# Patient Record
Sex: Female | Born: 2000 | Race: White | Hispanic: No | Marital: Single | Smoking: Never smoker
Health system: Southern US, Community
[De-identification: ages and names within clinical notes are randomized; demographics above are authoritative.]

---
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
## 2021-02-07 IMAGING — CR DG ANKLE COMPLETE 3+V*R*
1 series · 3 of 3 positions shown · non-contrast
Comparison: None.

CLINICAL DATA: Right ankle pain and swelling after twisting injury
while walking.

EXAM:
RIGHT ANKLE - COMPLETE 3+ VIEW

[Series 1: dg ankle complete right · 0.14mm/px · 3 of 3 slices shown]
[im 1/3]
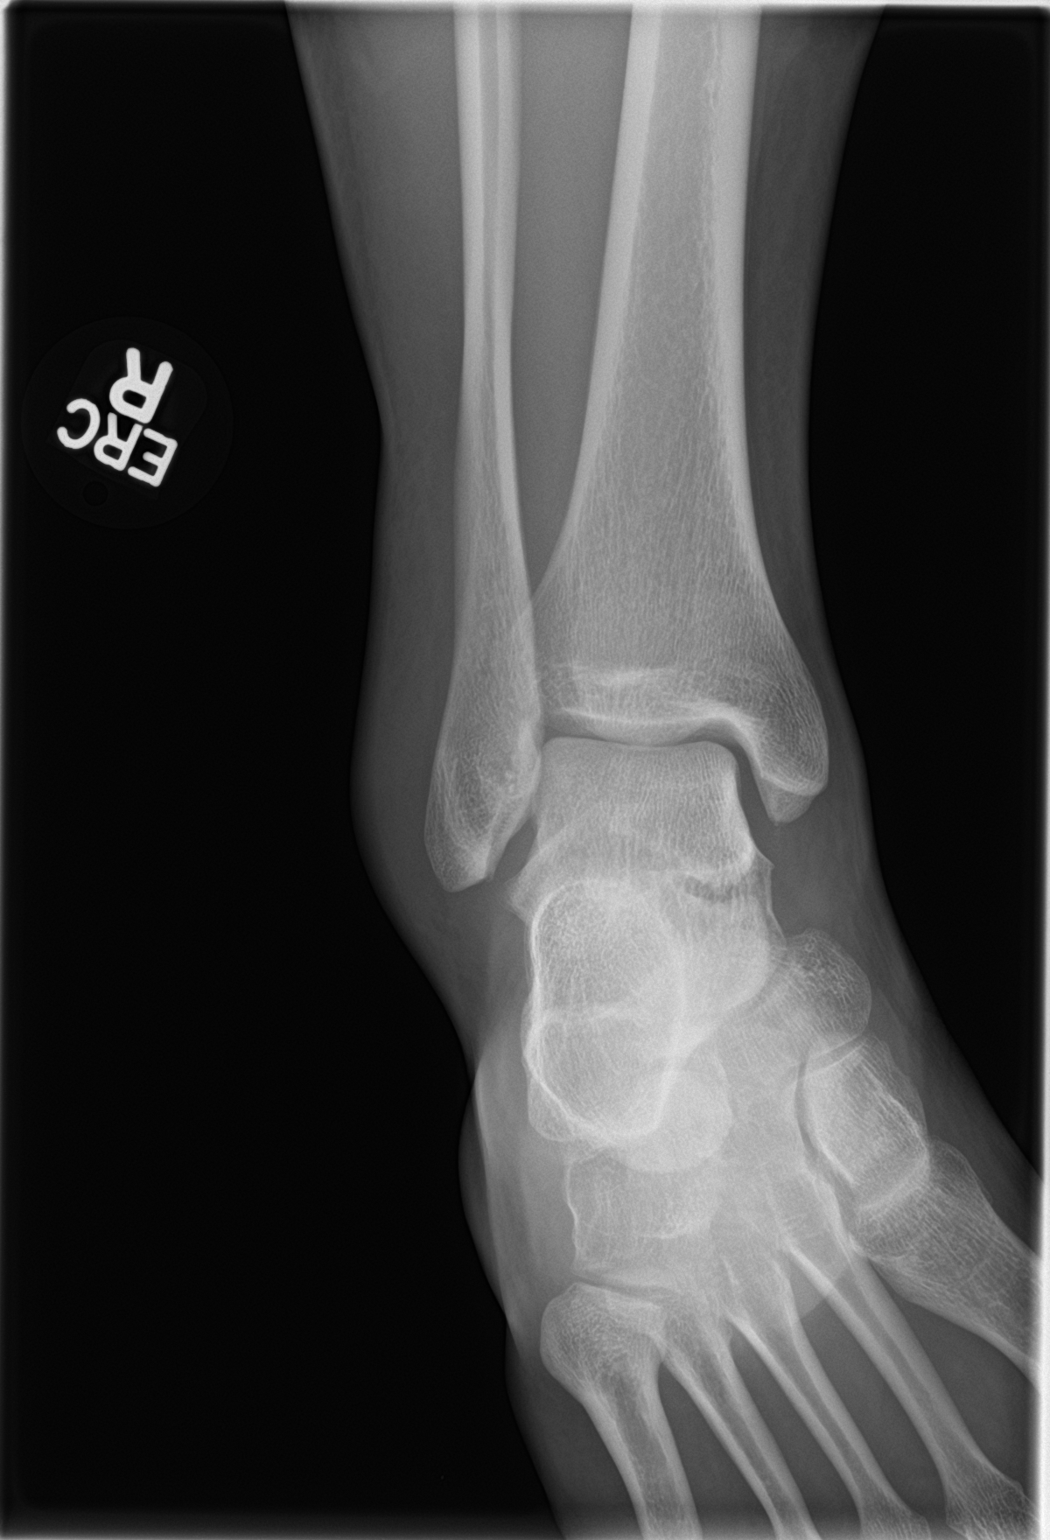
[im 2/3]
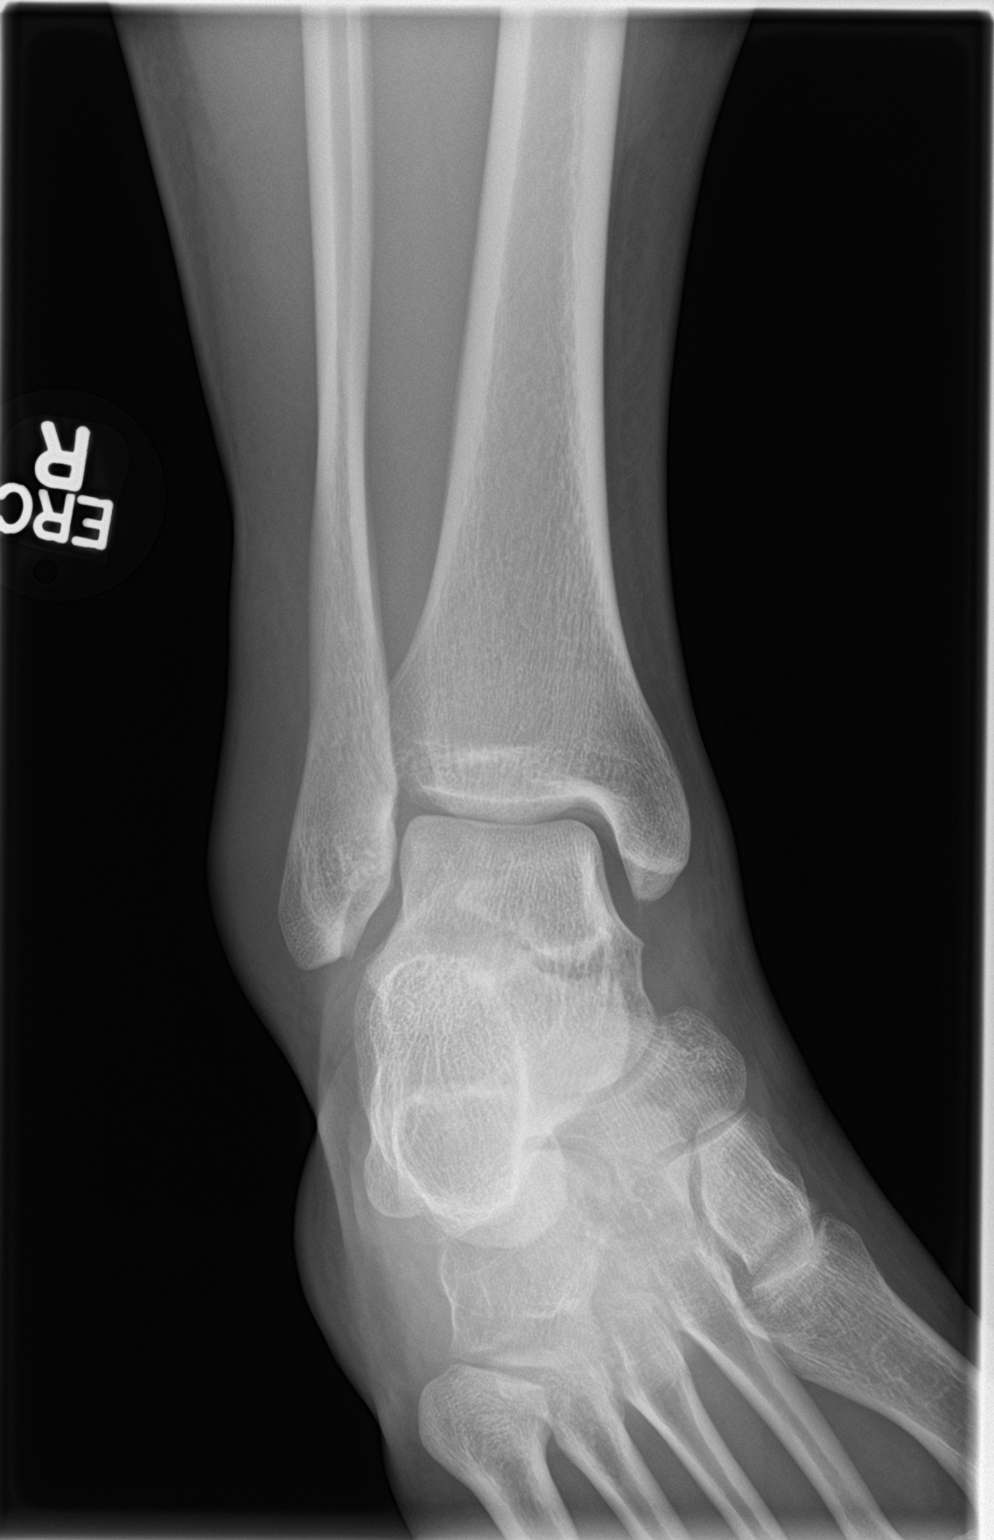
[im 3/3]
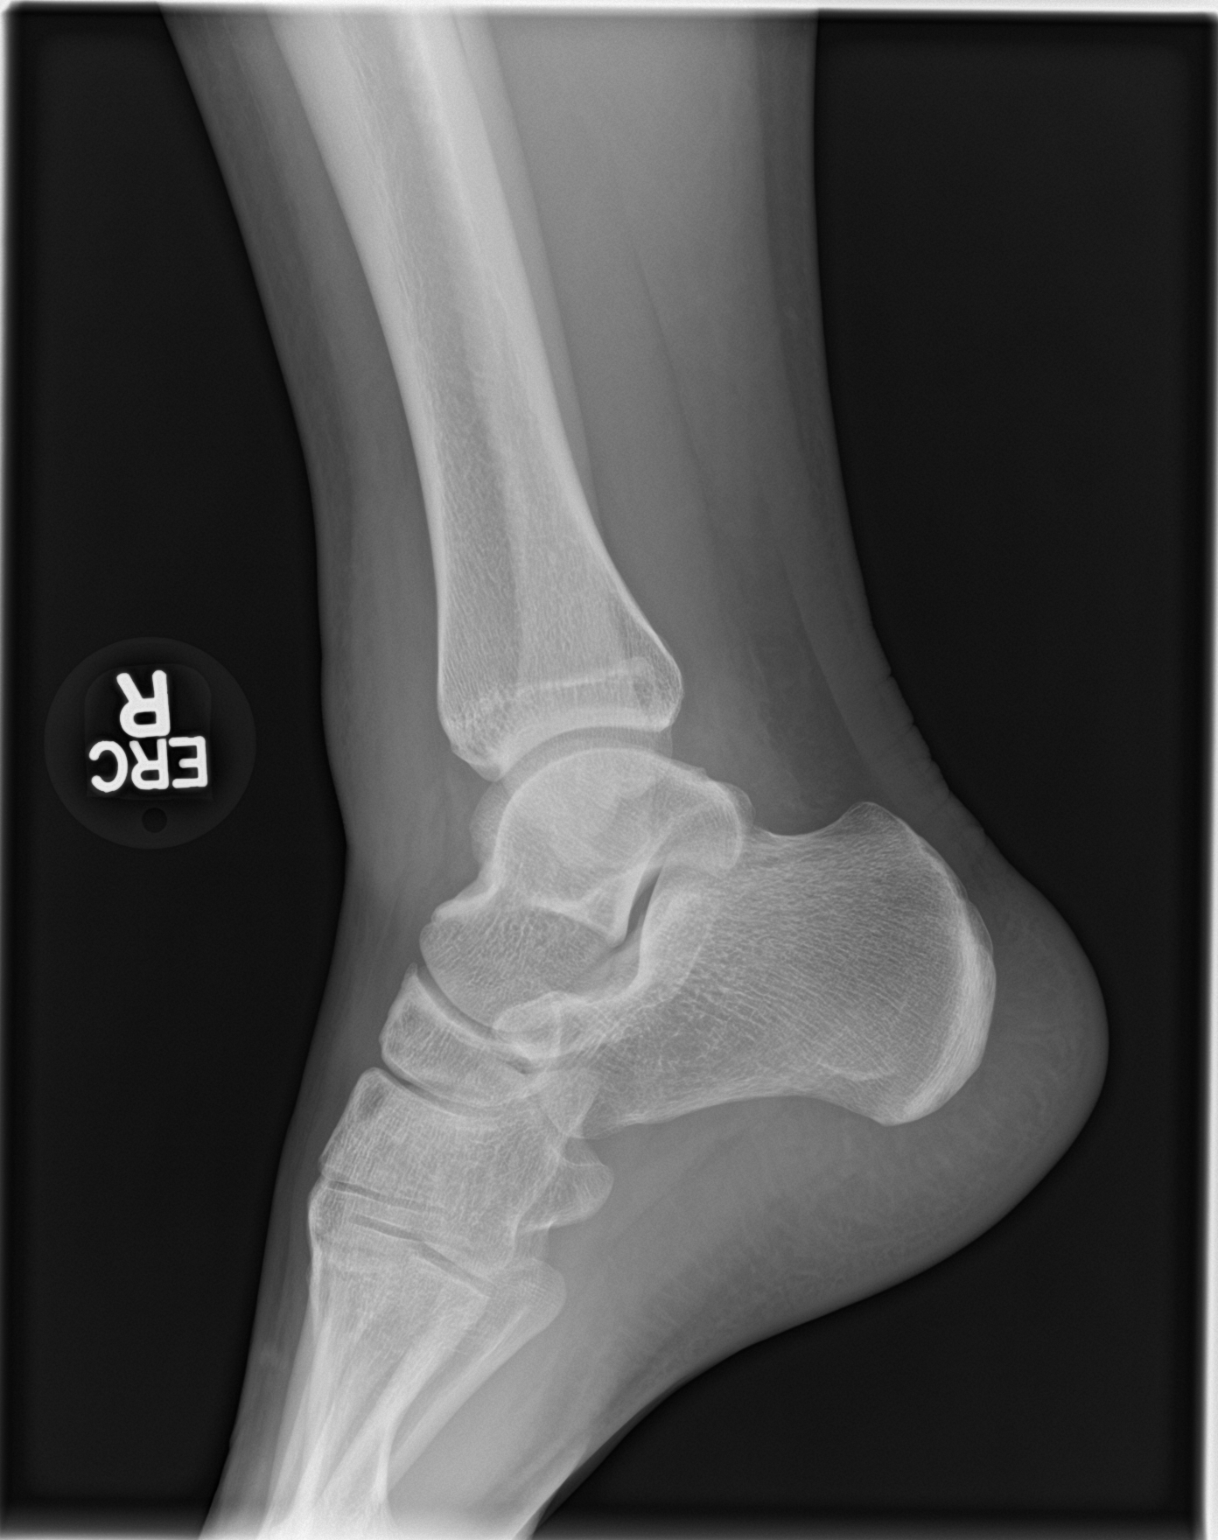

[3 of 3 positions shown; findings below may reference images not displayed]

FINDINGS: No acute fracture or dislocation. Slight widening of the lateral
clear space. Small tibial talar joint effusion. Anterolateral soft
tissue edema.
IMPRESSION: 1. No acute fracture or dislocation.
2. Slight widening of the lateral clear space which joint effusion
and soft tissue edema. Findings can be seen with
ligamentous/syndesmotic injury.

## 2022-08-18 ENCOUNTER — Ambulatory Visit: Payer: Managed Care, Other (non HMO) | Admitting: Adult Health

## 2022-09-05 ENCOUNTER — Ambulatory Visit: Payer: Managed Care, Other (non HMO) | Admitting: Family Medicine

## 2022-10-18 ENCOUNTER — Encounter: Payer: Self-pay | Admitting: Adult Health

## 2022-10-18 ENCOUNTER — Ambulatory Visit (INDEPENDENT_AMBULATORY_CARE_PROVIDER_SITE_OTHER): Payer: Managed Care, Other (non HMO) | Admitting: Adult Health

## 2022-10-18 VITALS — BP 112/72 | HR 74 | Temp 97.3°F | Ht 64.0 in | Wt 145.0 lb

## 2022-10-18 DIAGNOSIS — Z113 Encounter for screening for infections with a predominantly sexual mode of transmission: Secondary | ICD-10-CM

## 2022-10-18 NOTE — Progress Notes (Unsigned)
[place] [place], [place] [address] Phone: [phone] Fax: [phone]   Office Visit Note  Patient Name: Suzanne Rose  Date of [id number]  Med Rec number [id number]  Date of Service: 10/19/2022  Patient has no known allergies.  Chief Complaint  Patient presents with   STD     HPI  Patient is here reporting she was last tested in Jan 2022, she broke up with her long term boyfriend in Jan 2024.  Since then, she has had more than 5 partners.  She has sex with female partners and has oral and vaginal sex. She has been using condoms 50% of the time. She is specifically requesting Urine testing.   Current Medication:  Outpatient Encounter Medications as of 10/18/2022  Medication Sig   sertraline (ZOLOFT) 100 MG tablet Take 100 mg by mouth every morning.   traZODone (DESYREL) 100 MG tablet Take 100 mg by mouth at bedtime as needed.   LATUDA 40 MG TABS tablet SMARTSIG:1 Tablet(s) By Mouth Every Evening (Patient not taking: Reported on 10/18/2022)   lidocaine (XYLOCAINE) 2 % solution Use as directed 10 mLs in the mouth or throat as needed. (Patient not taking: Reported on 10/18/2022)   predniSONE (DELTASONE) 10 MG tablet Take 6 tablets on day 1, take 5 tablets on day 2, take 4 tablets on day 3, take 3 tablets on day 4, take 2 tablets on day 5, take 1 tablet on day 6 (Patient not taking: Reported on 10/18/2022)   No facility-administered encounter medications on file as of 10/18/2022.      Medical History: History reviewed. No pertinent past medical history.   Vital Signs: BP 112/72   Pulse 74   Temp (!) 97.3 F (36.3 C) (Tympanic)   Ht 5\' 4"  (1.626 m)   Wt 145 lb (65.8 kg)   SpO2 98%   BMI 24.89 kg/m    Review of Systems  Constitutional:  Negative for chills, fatigue and fever.  Genitourinary:  Negative for decreased urine volume, difficulty urinating, flank pain, genital sores and urgency.    Physical Exam Vitals and nursing note  reviewed.  Constitutional:      Appearance: Normal appearance.  Neurological:     Mental Status: She is alert.  Psychiatric:        Mood and Affect: Mood normal.        Thought Content: Thought content normal.     Assessment/Plan: 1. Screening examination for venereal disease Discussed available testing options.  Patient elected for Gonorrhea, Chlamydia testing today.  Patient will be contacted via MyChart when labs results are available.  Encouraged consistent condom use, as well as regular testing (every 3-6 months) when changing sex partners, or when having multiple partners.    - Ct/GC NAA, Pharyngeal - GC/Chlamydia Probe Amp     General Counseling: Autrey verbalizes understanding of the findings of todays visit and agrees with plan of treatment. I have discussed any further diagnostic evaluation that may be needed or ordered today. We also reviewed her medications today. she has been encouraged to call the office with any questions or concerns that should arise related to todays visit.   Orders Placed This Encounter  Procedures   Ct/GC NAA, Pharyngeal   GC/Chlamydia Probe Amp    No orders of the defined types were placed in this encounter.   Time spent:15 Minutes Time spent includes review of chart, medications, test results, and follow up plan with the patient.    Quita Skye  Lemar Lofty AGNP-C Nurse Practitioner

## 2022-10-20 ENCOUNTER — Encounter: Payer: Self-pay | Admitting: Adult Health

## 2022-10-20 LAB — CT/GC NAA, PHARYNGEAL
C TRACH RRNA NPH QL PCR: NEGATIVE
N GONORRHOEA RRNA NPH QL PCR: NEGATIVE

## 2022-10-20 LAB — GC/CHLAMYDIA PROBE AMP
Chlamydia trachomatis, NAA: NEGATIVE
Neisseria Gonorrhoeae by PCR: NEGATIVE
